# Patient Record
Sex: Male | Born: 1995 | Race: Black or African American | Hispanic: No | Marital: Single | Smoking: Never smoker
Health system: Southern US, Community
[De-identification: ages and names within clinical notes are randomized; demographics above are authoritative.]

## PROBLEM LIST (undated history)

## (undated) DIAGNOSIS — J45909 Unspecified asthma, uncomplicated: Secondary | ICD-10-CM

## (undated) HISTORY — DX: Unspecified asthma, uncomplicated: J45.909

## (undated) HISTORY — PX: LEG SURGERY: SHX1003

---
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
## 2014-12-01 ENCOUNTER — Emergency Department (HOSPITAL_COMMUNITY): Payer: Self-pay

## 2014-12-01 ENCOUNTER — Encounter (HOSPITAL_COMMUNITY): Payer: Self-pay

## 2014-12-01 ENCOUNTER — Emergency Department (HOSPITAL_COMMUNITY)
Admission: EM | Admit: 2014-12-01 | Discharge: 2014-12-01 | Disposition: A | Discharge status: Home / Self Care | Payer: Self-pay | Attending: Emergency Medicine | Admitting: Emergency Medicine

## 2014-12-01 DIAGNOSIS — S161XXA Strain of muscle, fascia and tendon at neck level, initial encounter: Secondary | ICD-10-CM | POA: Insufficient documentation

## 2014-12-01 DIAGNOSIS — J45909 Unspecified asthma, uncomplicated: Secondary | ICD-10-CM | POA: Insufficient documentation

## 2014-12-01 DIAGNOSIS — Y998 Other external cause status: Secondary | ICD-10-CM | POA: Insufficient documentation

## 2014-12-01 DIAGNOSIS — Y9389 Activity, other specified: Secondary | ICD-10-CM | POA: Insufficient documentation

## 2014-12-01 DIAGNOSIS — Y92410 Unspecified street and highway as the place of occurrence of the external cause: Secondary | ICD-10-CM | POA: Insufficient documentation

## 2014-12-01 MED ORDER — CYCLOBENZAPRINE HCL 10 MG PO TABS
5.0000 mg | ORAL_TABLET | Freq: Once | ORAL | Status: AC
  Administered 2014-12-01: 5 mg via ORAL
  Filled 2014-12-01: qty 1

## 2014-12-01 MED ORDER — ACETAMINOPHEN 325 MG PO TABS
650.0000 mg | ORAL_TABLET | Freq: Once | ORAL | Status: AC
  Administered 2014-12-01: 650 mg via ORAL
  Filled 2014-12-01: qty 2

## 2014-12-01 MED ORDER — CYCLOBENZAPRINE HCL 5 MG PO TABS: 5.0000 mg | ORAL_TABLET | Freq: Two times a day (BID) | ORAL | Status: AC | PRN

## 2014-12-01 MED ORDER — NAPROXEN 375 MG PO TABS: 375.0000 mg | ORAL_TABLET | Freq: Two times a day (BID) | ORAL | Status: AC

## 2014-12-01 NOTE — ED Notes (Signed)
Patient transported to X-ray 
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
